# Patient Record
Sex: Male | Born: 1970 | Race: White | Hispanic: No | Marital: Single
Health system: Southern US, Community
[De-identification: ages and names within clinical notes are randomized; demographics above are authoritative.]

---

## 2015-12-11 ENCOUNTER — Other Ambulatory Visit: Payer: Self-pay | Admitting: Family Medicine

## 2015-12-11 DIAGNOSIS — R1310 Dysphagia, unspecified: Secondary | ICD-10-CM

## 2015-12-18 ENCOUNTER — Ambulatory Visit
Admission: RE | Admit: 2015-12-18 | Discharge: 2015-12-18 | Disposition: A | Discharge status: Home / Self Care | Payer: BLUE CROSS/BLUE SHIELD | Source: Ambulatory Visit | Attending: Family Medicine | Admitting: Family Medicine

## 2015-12-18 DIAGNOSIS — R1310 Dysphagia, unspecified: Secondary | ICD-10-CM | POA: Diagnosis not present

## 2016-09-16 IMAGING — RF DG ESOPHAGUS
5 series · 19 of 20 positions shown · non-contrast
Comparison: None.

CLINICAL DATA: Dysphagia.

EXAM:
ESOPHOGRAM / BARIUM SWALLOW / BARIUM TABLET STUDY
TECHNIQUE: Combined double contrast and single contrast examination performed
using effervescent crystals, thick barium liquid, and thin barium
liquid. The patient was observed with fluoroscopy swallowing a 13 mm
barium sulphate tablet.
FLUOROSCOPY TIME:  Radiation Exposure Index (as provided by the
fluoroscopic device):
If the device does not provide the exposure index:
Fluoroscopy Time:  1 minutes 24 seconds
Number of Acquired Images:

[Series 1: cp_standard · 0.37mm/px · 4 of 161 frames shown (1 of 5)]
[frame 19/161]
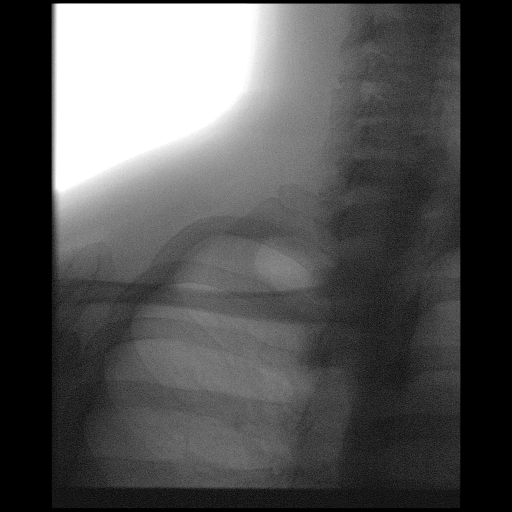
[frame 25/161]
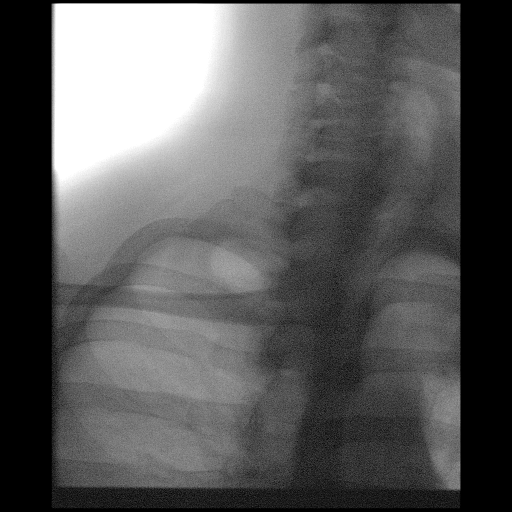
[frame 81/161]
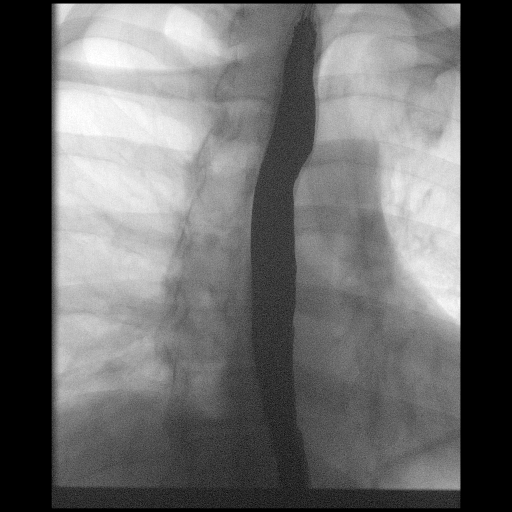
[frame 137/161]
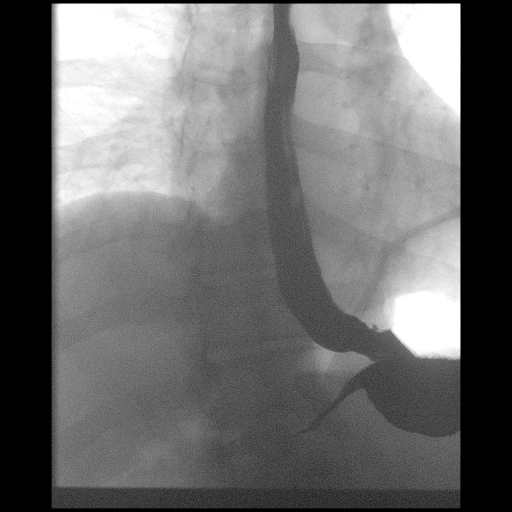

[Series 2: cp_standard · 0.36mm/px · 4 of 54 frames shown (2 of 5)]
[frame 9/54]
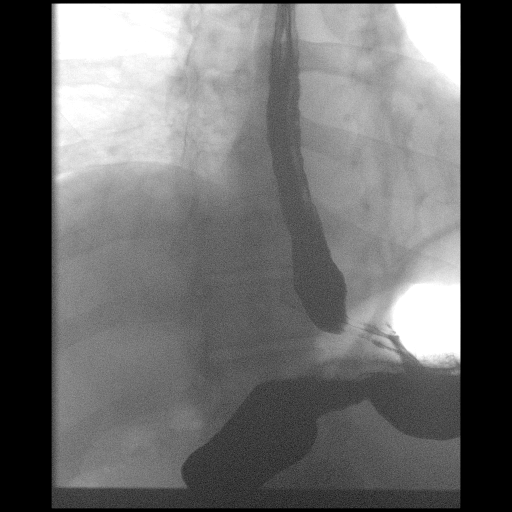
[frame 28/54]
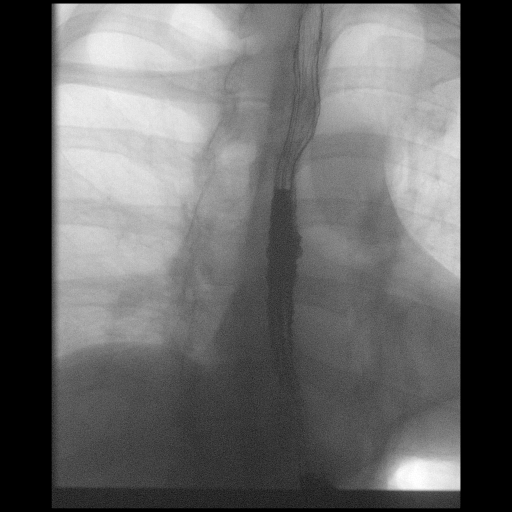
[frame 39/54]
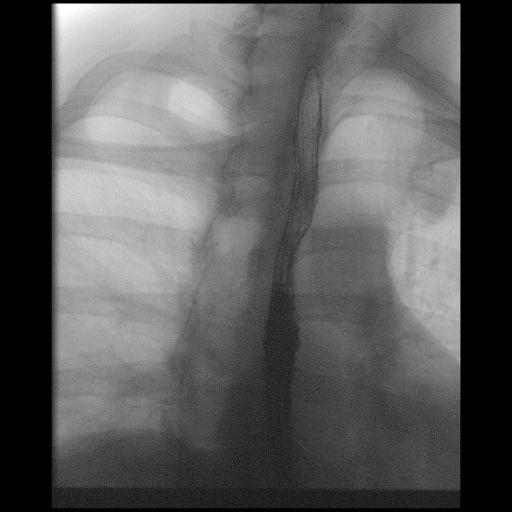
[frame 46/54]
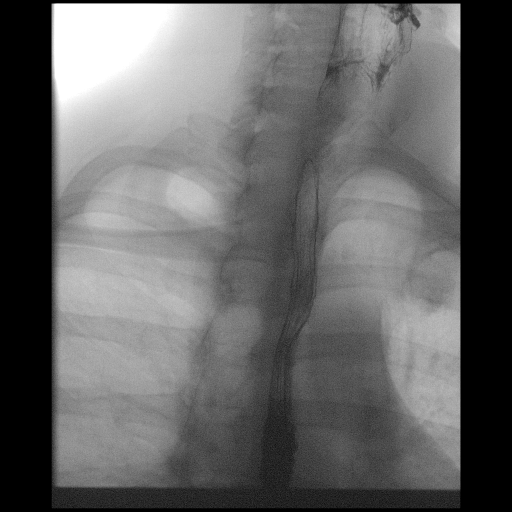

[Series 3: cp_standard · 0.35mm/px · 3 of 59 frames shown (3 of 5)]
[frame 9/59]
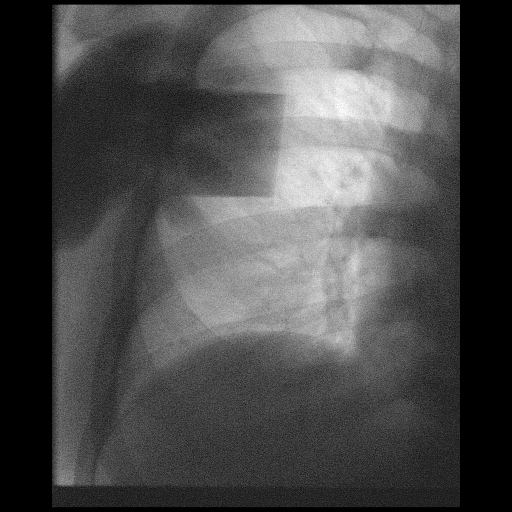
[frame 30/59]
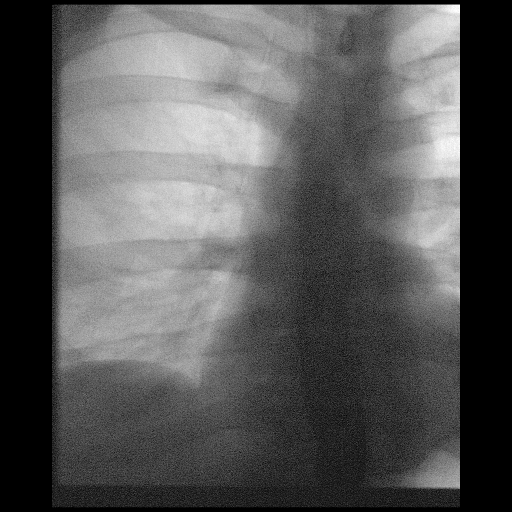
[frame 51/59]
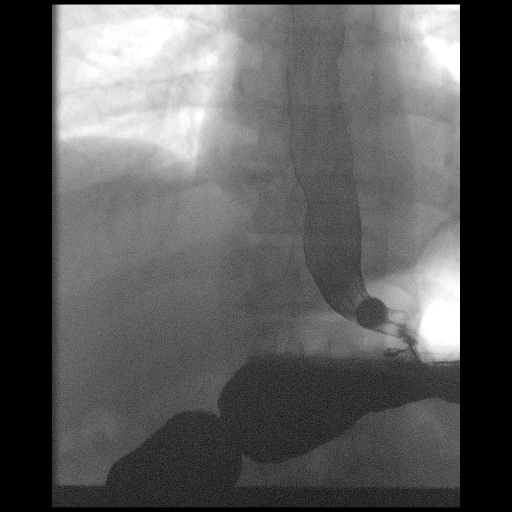

[Series 4: cp_standard · 0.37mm/px · 4 of 110 frames shown (4 of 5)]
[frame 11/110]
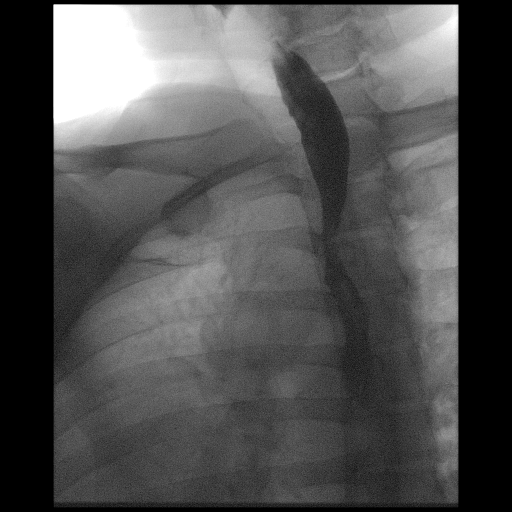
[frame 17/110]
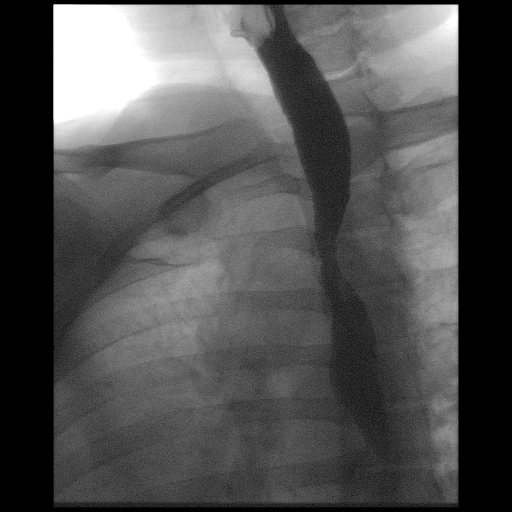
[frame 56/110]
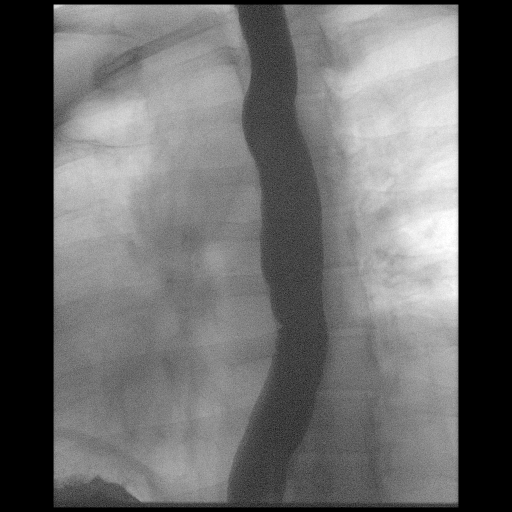
[frame 94/110]
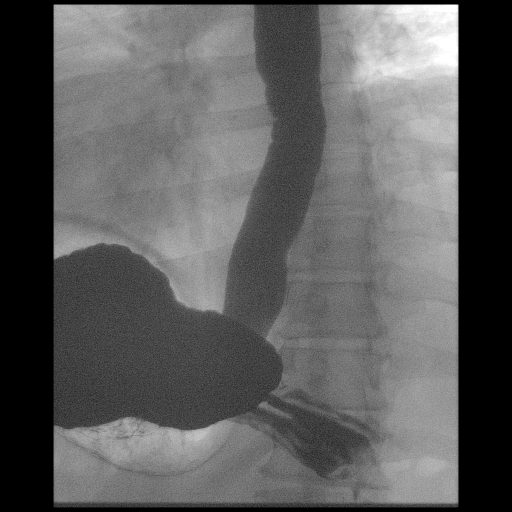

[Series 5: cp_standard · 0.37mm/px · 4 of 113 frames shown (5 of 5)]
[frame 17/113]
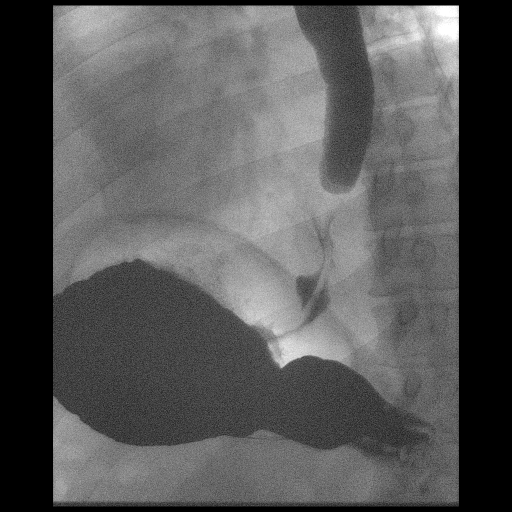
[frame 20/113]
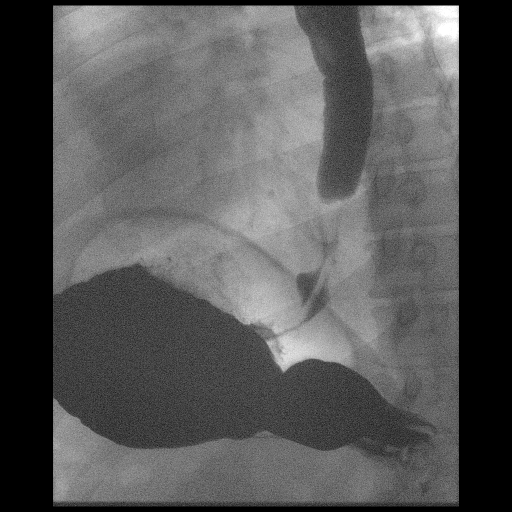
[frame 57/113]
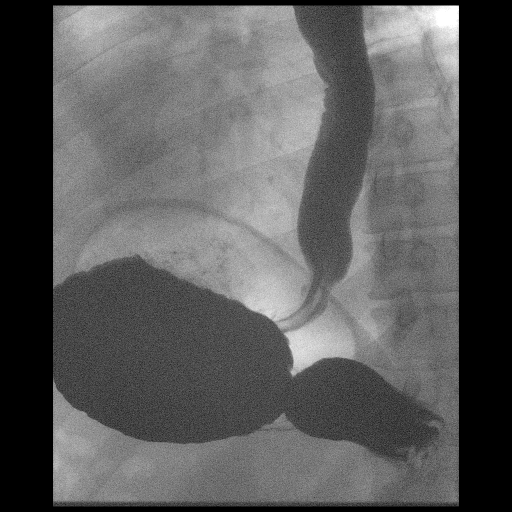
[frame 97/113]
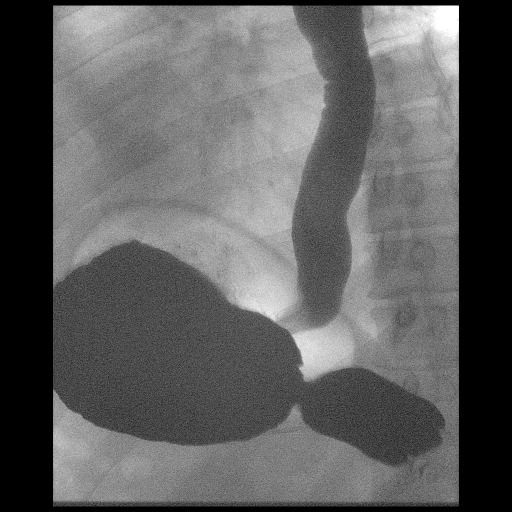

[19 of 20 positions shown; findings below may reference images not displayed]

FINDINGS: Esophageal mucosa and motility normal.  No stricture or mass lesion.

Negative for hiatal hernia. No gastroesophageal reflux demonstrated.

Barium tablet passed readily into the stomach without delay.
IMPRESSION: Negative barium swallow with  barium tablet.

## 2020-04-16 ENCOUNTER — Ambulatory Visit (INDEPENDENT_AMBULATORY_CARE_PROVIDER_SITE_OTHER): Payer: 59 | Admitting: Dermatology

## 2020-04-16 ENCOUNTER — Other Ambulatory Visit: Payer: Self-pay

## 2020-04-16 DIAGNOSIS — L814 Other melanin hyperpigmentation: Secondary | ICD-10-CM

## 2020-04-16 DIAGNOSIS — D692 Other nonthrombocytopenic purpura: Secondary | ICD-10-CM

## 2020-04-16 DIAGNOSIS — D229 Melanocytic nevi, unspecified: Secondary | ICD-10-CM

## 2020-04-16 DIAGNOSIS — Z1283 Encounter for screening for malignant neoplasm of skin: Secondary | ICD-10-CM

## 2020-04-16 DIAGNOSIS — L720 Epidermal cyst: Secondary | ICD-10-CM

## 2020-04-16 DIAGNOSIS — L719 Rosacea, unspecified: Secondary | ICD-10-CM

## 2020-04-16 DIAGNOSIS — L821 Other seborrheic keratosis: Secondary | ICD-10-CM

## 2020-04-16 DIAGNOSIS — L918 Other hypertrophic disorders of the skin: Secondary | ICD-10-CM

## 2020-04-16 DIAGNOSIS — L578 Other skin changes due to chronic exposure to nonionizing radiation: Secondary | ICD-10-CM

## 2020-04-16 MED ORDER — IVERMECTIN 1 % EX CREA: TOPICAL_CREAM | CUTANEOUS | 5 refills | Status: AC

## 2020-04-16 MED ORDER — DOXYCYCLINE 40 MG PO CPDR: DELAYED_RELEASE_CAPSULE | ORAL | 5 refills | Status: DC

## 2020-04-16 NOTE — Patient Instructions (Addendum)
Doxycycline should be taken with food to prevent nausea. Do not lay down for 30 minutes after taking. Be cautious with sun exposure and use good sun protection while on this medication. Pregnant women should not take this medication.   Recommend daily broad spectrum sunscreen SPF 30+ to sun-exposed areas, reapply every 2 hours as needed. Call for new or changing lesions.  Your medications have been sent to [facility] and will be mailed to you after you call them and confirm your information with them.   [facility] [phone], [place] [address]  Seborrheic Keratosis  What causes seborrheic keratoses? Seborrheic keratoses are harmless, common skin growths that first appear during adult life.  As time goes by, more growths appear.  Some people may develop a large number of them.  Seborrheic keratoses appear on both covered and uncovered body parts.  They are not caused by sunlight.  The tendency to develop seborrheic keratoses can be inherited.  They vary in color from skin-colored to gray, brown, or even black.  They can be either smooth or have a rough, warty surface.   Seborrheic keratoses are superficial and look as if they were stuck on the skin.  Under the microscope this type of keratosis looks like layers upon layers of skin.  That is why at times the top layer may seem to fall off, but the rest of the growth remains and re-grows.    Treatment Seborrheic keratoses do not need to be treated, but can easily be removed in the office.  Seborrheic keratoses often cause symptoms when they rub on clothing or jewelry.  Lesions can be in the way of shaving.  If they become inflamed, they can cause itching, soreness, or burning.  Removal of a seborrheic keratosis can be accomplished by freezing, burning, or surgery. If any spot bleeds, scabs, or grows rapidly, please return to have it checked, as these can be an indication of a skin cancer.

## 2020-04-16 NOTE — Progress Notes (Signed)
New Patient Visit  Subjective  Jerome Medina is a 49 y.o. male who presents for the following: Annual Exam and Rosacea.  Patient here today for UBSE. No history of skin cancer but would like back checked. Patient also has rosacea and is taking doxycycline 100mg  every other day due to stomach upset. He has used metronidazole 1% gel and Finacea foam in the past with no improvement.   The following portions of the chart were reviewed this encounter and updated as appropriate:      Review of Systems:  No other skin or systemic complaints except as noted in HPI or Assessment and Plan.  Objective  Well appearing patient in no apparent distress; mood and affect are within normal limits.  All skin waist up examined.  Objective  Head - Anterior (Face): Inflammatory papules on right malar cheek, forehead. Violaceous erythema on distal nose/nasal tip.  Objective  Left Neck - Anterior: 43mm firm subq nodule with erythema, pt states came up this week.   Assessment & Plan  Rosacea Head - Anterior (Face)  Start Oracea 40mg  1 Po QD with food #30 5RF Start Soolantra to AA's face QHS  Recommend BBL to nose Sunscreen daily- sample of Clear Skin by [facility]  Patient will plan to have BBL laser done in winter months with Sonia Baller. Multiple treatments necessary to best result.  $200/tx if treating nose only, $350/tx if treating full face.  Doxycycline should be taken with food to prevent nausea. Do not lay down for 30 minutes after taking. Be cautious with sun exposure and use good sun protection while on this medication. Pregnant women should not take this medication.   doxycycline (ORACEA) 40 MG capsule - Head - Anterior (Face)  Ivermectin (SOOLANTRA) 1 % CREA - Head - Anterior (Face)  Epidermal inclusion cyst Left Neck - Anterior  Inflamed. Reassured benign growth.  Recommend observation.  Discussed surgical excision in office if not resolving or symptomatic.   Take Oracea 40 mg PO  qd as prescribed   Seborrheic Keratoses - Stuck-on, waxy, tan-brown papules trunk - Discussed benign etiology and prognosis. - Observe - Call for any changes  Actinic Damage - diffuse scaly erythematous macules with underlying dyspigmentation - Recommend daily broad spectrum sunscreen SPF 30+ to sun-exposed areas, reapply every 2 hours as needed.  - Call for new or changing lesions.  Lentigines - Scattered tan macules - Discussed due to sun exposure - Benign, observe - Call for any changes  Acrochordons (Skin Tags) - Fleshy, skin-colored pedunculated papules neck - Benign appearing.  - Observe. - If desired, they can be removed with an in office procedure that is not covered by insurance. - Please call the clinic if you notice any new or changing lesions.  Melanocytic Nevi - Tan-brown and/or pink-flesh-colored symmetric macules and papules - Benign appearing on exam today - Observation - Call clinic for new or changing moles - Recommend daily use of broad spectrum spf 30+ sunscreen to sun-exposed areas.   Purpura - Violaceous macules and patches - Benign - Related to age, sun damage and/or use of blood thinners - Observe - Can use OTC arnica containing moisturizer such as Dermend Bruise Formula if desired - Call for worsening or other concerns  Skin cancer screening performed today.  Return for laser with Sonia Baller for rosacea.  Graciella Belton, RMA, am acting as scribe for Brendolyn Patty, MD . Documentation: I have reviewed the above documentation for accuracy and completeness, and I agree with the above.  Brendolyn Patty MD

## 2020-08-26 ENCOUNTER — Other Ambulatory Visit: Payer: Self-pay

## 2020-08-26 DIAGNOSIS — L719 Rosacea, unspecified: Secondary | ICD-10-CM

## 2020-08-26 MED ORDER — DOXYCYCLINE 40 MG PO CPDR
DELAYED_RELEASE_CAPSULE | ORAL | 5 refills | Status: DC
Start: 2020-08-26 — End: 2023-07-26

## 2020-08-26 NOTE — Progress Notes (Signed)
Patient would like Doxycycline sent [facility].

## 2020-10-14 ENCOUNTER — Ambulatory Visit: Payer: 59

## 2023-07-26 ENCOUNTER — Ambulatory Visit: Payer: 59 | Admitting: Dermatology

## 2023-07-26 ENCOUNTER — Encounter: Payer: Self-pay | Admitting: Dermatology

## 2023-07-26 VITALS — BP 111/79 | HR 85

## 2023-07-26 DIAGNOSIS — L578 Other skin changes due to chronic exposure to nonionizing radiation: Secondary | ICD-10-CM | POA: Diagnosis not present

## 2023-07-26 DIAGNOSIS — W908XXA Exposure to other nonionizing radiation, initial encounter: Secondary | ICD-10-CM

## 2023-07-26 DIAGNOSIS — L711 Rhinophyma: Secondary | ICD-10-CM | POA: Diagnosis not present

## 2023-07-26 DIAGNOSIS — B079 Viral wart, unspecified: Secondary | ICD-10-CM

## 2023-07-26 DIAGNOSIS — L719 Rosacea, unspecified: Secondary | ICD-10-CM

## 2023-07-26 DIAGNOSIS — L821 Other seborrheic keratosis: Secondary | ICD-10-CM

## 2023-07-26 DIAGNOSIS — Z79899 Other long term (current) drug therapy: Secondary | ICD-10-CM

## 2023-07-26 DIAGNOSIS — L988 Other specified disorders of the skin and subcutaneous tissue: Secondary | ICD-10-CM

## 2023-07-26 MED ORDER — DOXYCYCLINE MONOHYDRATE 100 MG PO CAPS: 100.0000 mg | ORAL_CAPSULE | Freq: Two times a day (BID) | ORAL | 0 refills | Status: DC

## 2023-07-26 NOTE — Progress Notes (Signed)
Follow-Up Visit   Subjective  Jerome Medina is a 52 y.o. male who presents for the following: Rosacea - with hx of cyst/styes on eyelids, patient prescribed Doxycycline 100 mg po QD by either his PCP or eye doctor, but he is unsure which. If he runs out of Doxycycline both conditions flare, no s/e from Doxycycline per pt. He would like to discuss treatment options today.  The following portions of the chart were reviewed this encounter and updated as appropriate: medications, allergies, medical history  Review of Systems:  No other skin or systemic complaints except as noted in HPI or Assessment and Plan.  Objective  Well appearing patient in no apparent distress; mood and affect are within normal limits.  Areas Examined: face  Relevant physical exam findings are noted in the Assessment and Plan.  R 3rd dorsal finger x 1 Verrucous papules -- Discussed viral etiology and contagion.     Assessment & Plan  Rosacea  Related Medications Ivermectin (SOOLANTRA) 1 % CREA Apply to affected areas face at bedtime.  Viral warts, unspecified type R 3rd dorsal finger x 1  Viral Wart (HPV) Counseling  Discussed viral / HPV (Human Papilloma Virus) etiology and risk of spread /infectivity to other areas of body as well as to other people.  Multiple treatments and methods may be required to clear warts and it is possible treatment may not be successful.  Treatment risks include discoloration; scarring and there is still potential for wart recurrence.  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           Exam:  Mid face erythema with telangiectasias +/- scattered inflammatory papules.  Chronic and persistent condition with duration or expected duration over one year. Condition is symptomatic/ bothersome to patient. Not currently at goal.  Rosacea is a chronic progressive skin condition usually affecting the face of adults, causing redness and/or acne bumps. It is treatable but not curable. It sometimes affects the eyes (ocular rosacea) as well. It may respond to topical and/or systemic medication and can flare with stress, sun exposure, alcohol, exercise, topical steroids (including hydrocortisone/cortisone 10) and some foods.  Daily application of broad spectrum spf 30+ sunscreen to face is recommended to reduce flares.  Treatment Plan Continue Doxycycline 100 mg po but increase to BID x 1 month with 1 month follow up. Doxycycline should be taken with food to prevent nausea. Do not lay down for 30 minutes after taking. Be cautious with sun exposure and use good sun protection while on this medication. Pregnant women should not take this medication.  May continue Soolantra cream patient uses sparingly, because he doesn't like to use topical medications.   Counseling for BBL / IPL / Laser and Coordination of Care Discussed the treatment option of Broad Band Light (BBL) /Intense Pulsed Light (IPL)/ Laser for skin discoloration, including brown spots and redness.  Typically we recommend at least 1-3 treatment sessions about 5-8 weeks apart for  best results.  Cannot have tanned skin when BBL performed, and regular use of sunscreen/photoprotection is advised after the procedure to help maintain results. The patient's condition may also require "maintenance treatments" in the future.  The fee for BBL / laser  treatments is $350 per treatment session for the whole face.  A fee can be quoted for other parts of the body.  Insurance typically does not pay for BBL/laser treatments and therefore the fee is an out-of-pocket cost. Recommend prophylactic valtrex treatment. Once scheduled for procedure, will send Rx in prior to patient's appointment.   Multiple treatments necessary to best result. $200/tx if treating nose only, $350/tx if treating full face.   Long term medication management.  Patient is using long term (months to years) prescription medication  to control their dermatologic condition.  These medications require periodic monitoring to evaluate for efficacy and side effects.   SEBORRHEIC KERATOSIS - Stuck-on, waxy, tan-brown papules and/or plaques  - Benign-appearing - Discussed benign etiology and prognosis. - Observe - Call for any changes  ACTINIC DAMAGE - chronic, secondary to cumulative UV radiation exposure/sun exposure over time - diffuse scaly erythematous macules with underlying dyspigmentation - Recommend daily broad spectrum sunscreen SPF 30+ to sun-exposed areas, reapply every 2 hours as needed.  - Recommend staying in the shade or wearing long sleeves, sun glasses (UVA+UVB protection) and wide brim hats (4-inch brim around the entire circumference of the hat). - Call for new or changing lesions.  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   Recommend daily broad spectrum sunscreen SPF 30+ to sun-exposed areas, reapply every 2 hours as needed. Call for new or changing lesions.  Staying in the shade or wearing long sleeves, sun glasses (UVA+UVB protection) and wide brim hats (4-inch brim around the entire circumference of the hat) are also recommended for sun protection.    Return in about 1 month (around 08/26/2023) for wart and rosacea follow up; BBL for rosacea with Dr. Roseanne Reno .  Maylene Roes, CMA, am acting as scribe for Elie Goody, MD .  Documentation: I have reviewed the above documentation for accuracy and completeness, and I agree with the above.  Elie Goody, MD

## 2023-07-26 NOTE — Patient Instructions (Signed)

## 2023-08-16 ENCOUNTER — Telehealth: Payer: Self-pay

## 2023-08-16 NOTE — Telephone Encounter (Signed)
Patient called concerned about a bill for services that he received for $800 for laser treatment which he didn't have done. I discussed with Dorathy Daft RMA our patient care coordinator and she said that the charges were for his visit and destruction of lesion, not laser treatment. I called patient back and discussed the charges and let him know his balance was $499.50 since insurance did cover some of the expensive, and he was upset that the charges were so expensive, and requested that I cancel his next appointment.

## 2023-08-26 ENCOUNTER — Ambulatory Visit: Payer: 59 | Admitting: Dermatology

## 2023-10-07 ENCOUNTER — Other Ambulatory Visit: Payer: Self-pay | Admitting: Dermatology
